# Patient Record
Sex: Male | Born: 1960 | Race: White | Hispanic: No | Marital: Married | Smoking: Never smoker
Health system: Southern US, Community
[De-identification: ages and names within clinical notes are randomized; demographics above are authoritative.]

## PROBLEM LIST (undated history)

## (undated) DIAGNOSIS — E785 Hyperlipidemia, unspecified: Secondary | ICD-10-CM

## (undated) HISTORY — PX: TONSILLECTOMY: SUR1361

---

## 2004-05-01 ENCOUNTER — Ambulatory Visit: Payer: Self-pay | Admitting: Otolaryngology

## 2005-08-20 ENCOUNTER — Emergency Department: Payer: Self-pay | Admitting: General Practice

## 2005-08-23 ENCOUNTER — Emergency Department: Payer: Self-pay | Admitting: Emergency Medicine

## 2005-08-27 ENCOUNTER — Emergency Department: Payer: Self-pay | Admitting: Unknown Physician Specialty

## 2005-09-03 ENCOUNTER — Emergency Department: Payer: Self-pay | Admitting: Emergency Medicine

## 2005-09-18 ENCOUNTER — Emergency Department: Payer: Self-pay | Admitting: Emergency Medicine

## 2007-02-02 ENCOUNTER — Ambulatory Visit: Payer: Self-pay | Admitting: Family Medicine

## 2007-02-03 ENCOUNTER — Ambulatory Visit: Payer: Self-pay | Admitting: Family Medicine

## 2007-04-21 ENCOUNTER — Ambulatory Visit: Payer: Self-pay | Admitting: Family Medicine

## 2007-05-10 ENCOUNTER — Ambulatory Visit: Payer: Self-pay | Admitting: Otolaryngology

## 2007-10-16 ENCOUNTER — Ambulatory Visit: Payer: Self-pay | Admitting: Internal Medicine

## 2009-02-06 IMAGING — CT CT MAXILLOFACIAL WITHOUT CONTRAST
1 series · 16 of 30 positions shown, 20 images · non-contrast
Comparison: none

REASON FOR EXAM: Nasal Obstruction Deviated Septum
COMMENTS:

[Series 3: (person_name) series (id) · axial · 0.33mm/px · z∈[-706,-508]mm · 16 of 214 slices shown, 20 images]
[im 8/214  brain]
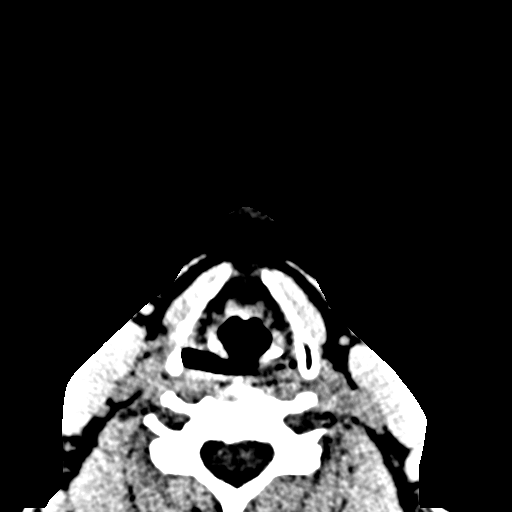
[im 8/214  bone]
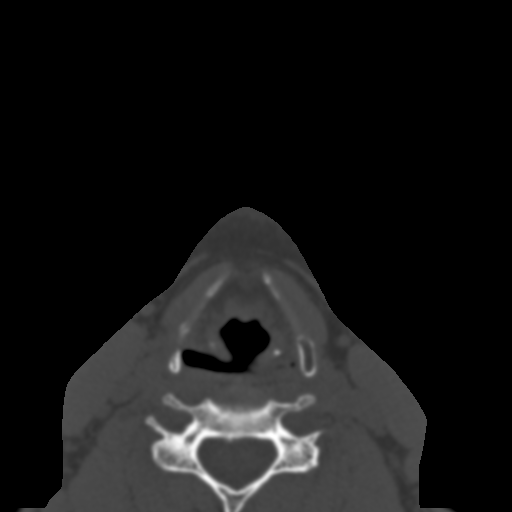
[im 23/214  bone]
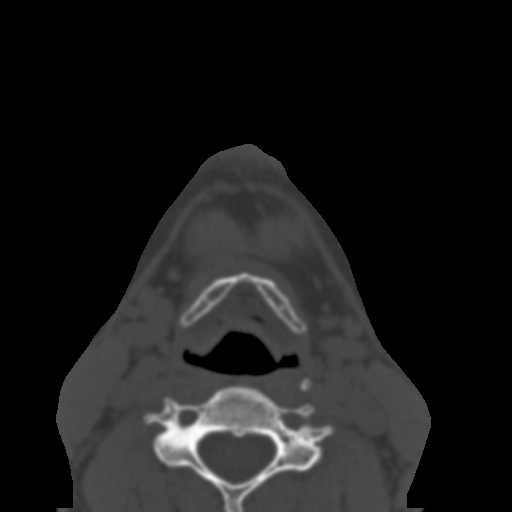
[im 37/214  bone]
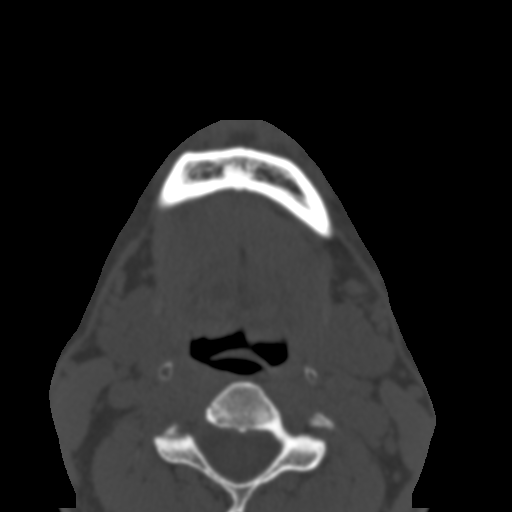
[im 52/214  bone]
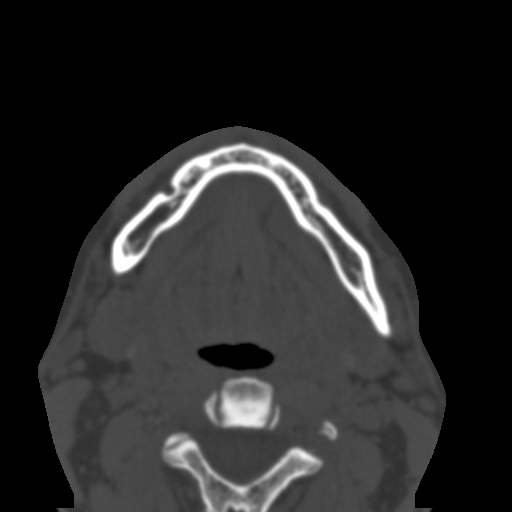
[im 59/214  brain]
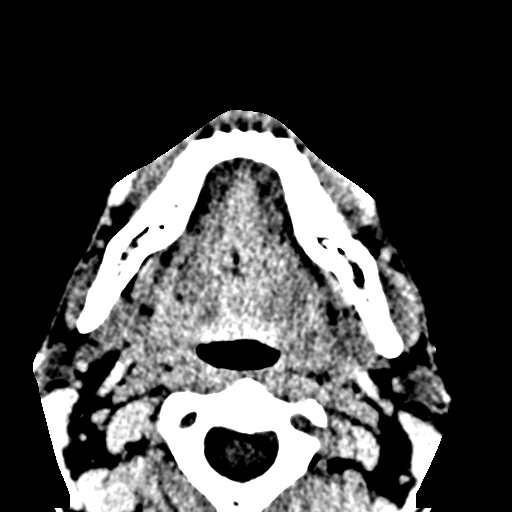
[im 59/214  bone]
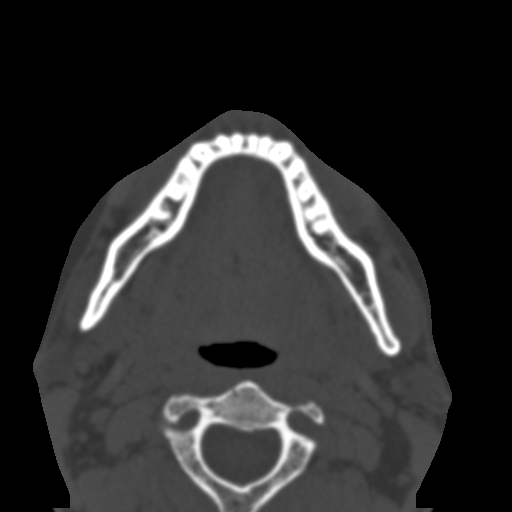
[im 74/214  bone]
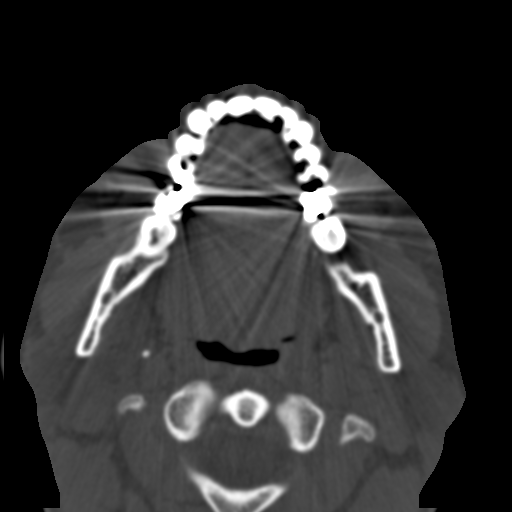
[im 89/214  bone]
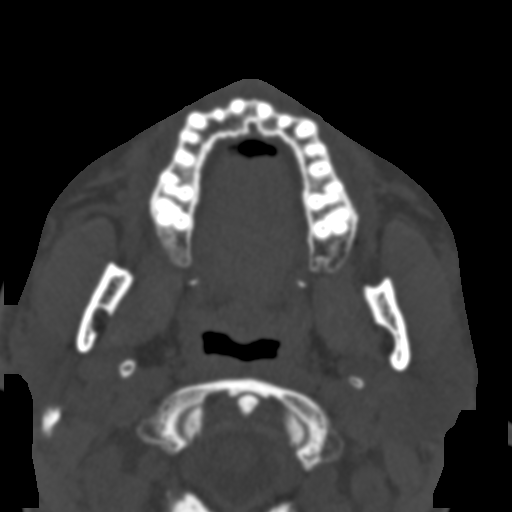
[im 103/214  bone]
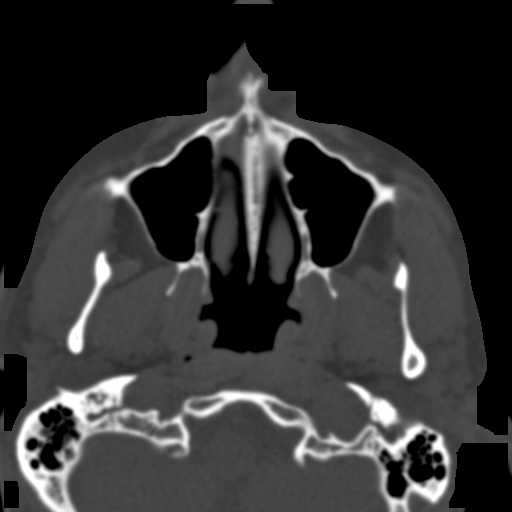
[im 111/214  brain]
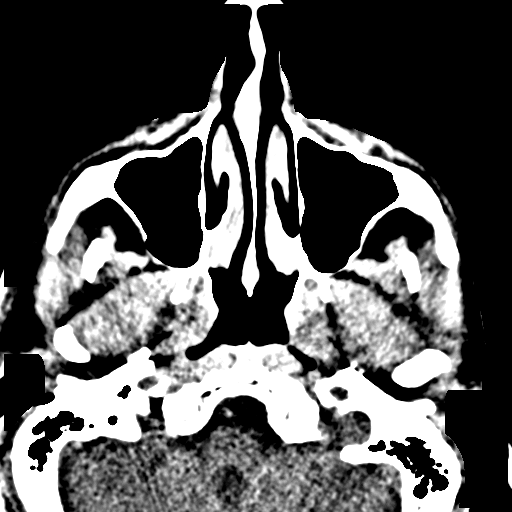
[im 111/214  bone]
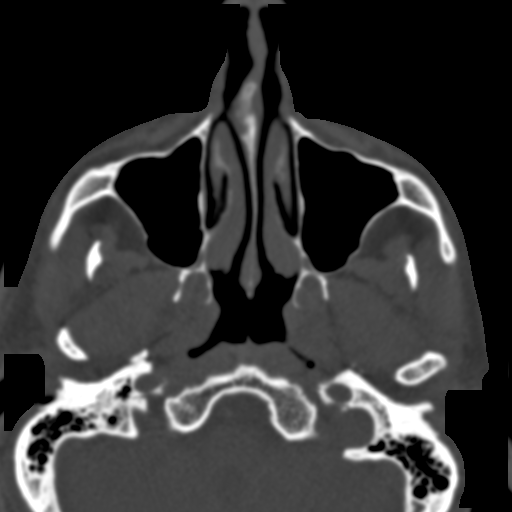
[im 125/214  bone]
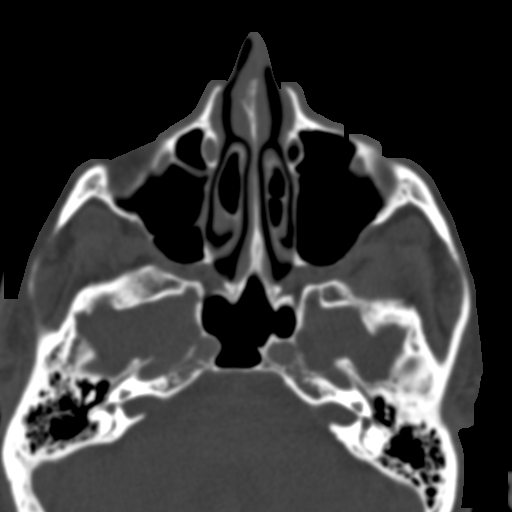
[im 140/214  bone]
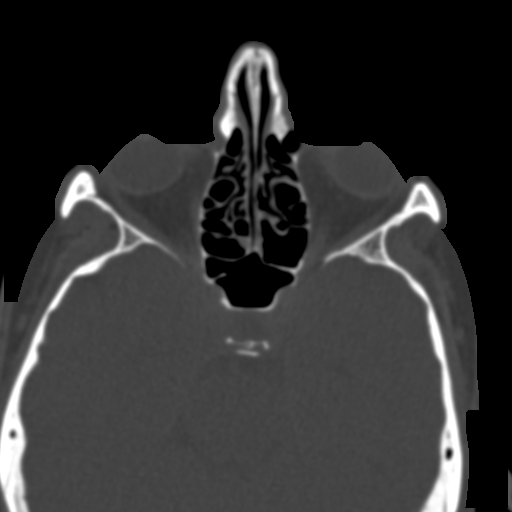
[im 155/214  bone]
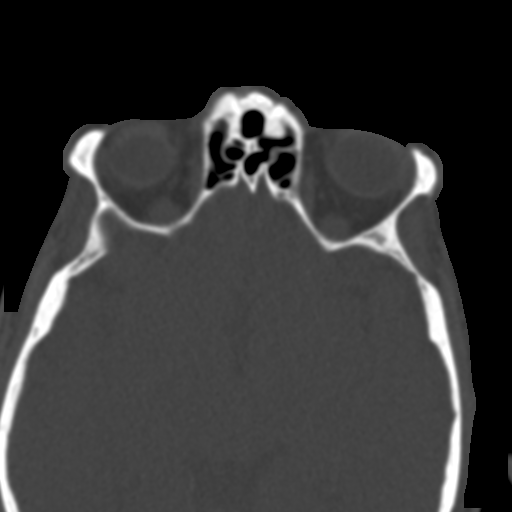
[im 162/214  brain]
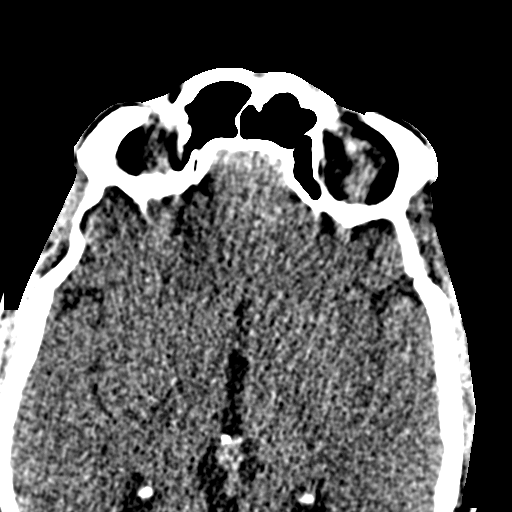
[im 162/214  bone]
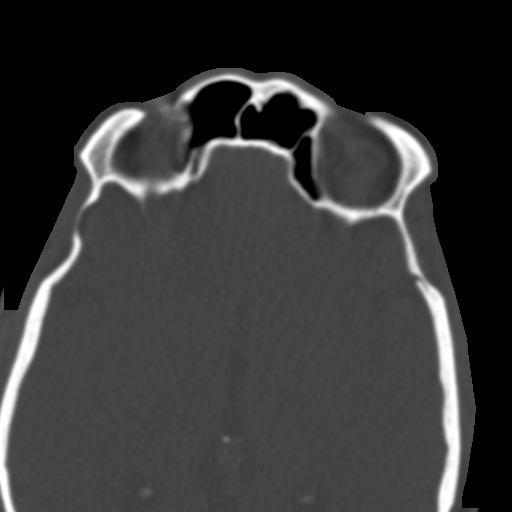
[im 177/214  bone]
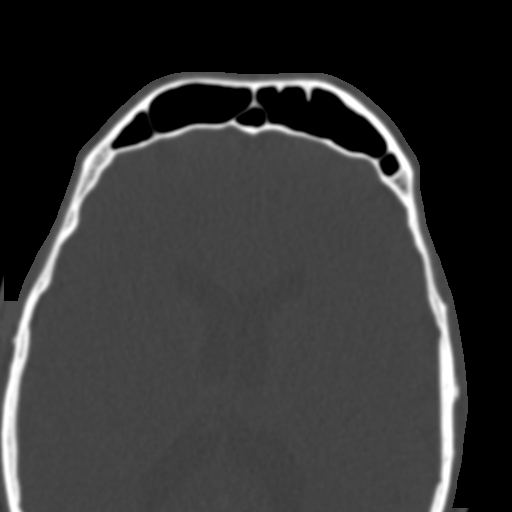
[im 191/214  bone]
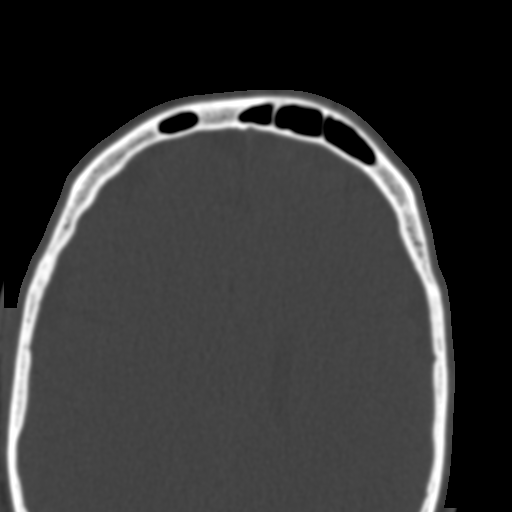
[im 206/214  bone]
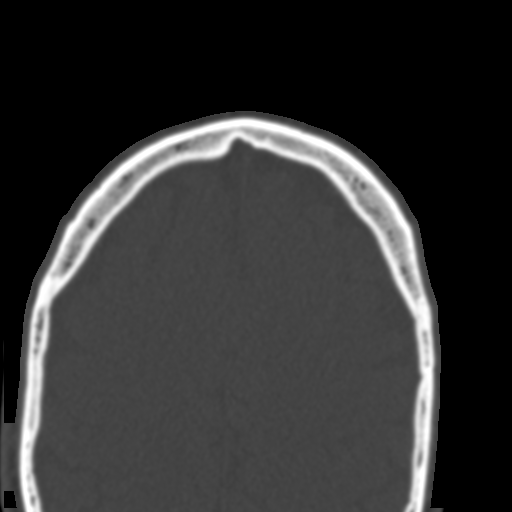

[16 of 30 positions shown; findings below may reference images not displayed]

PROCEDURE:     CT  - CT MAXILLOFACIAL ([facility])  - May 10, 2007 [DATE]

RESULT:     CT of the maxillofacial structures is reconstructed in the
axial, sagittal and coronal planes. The nasal septum shows minimal curvature
toward the LEFT. There is nasal mucosal thickening. The sinuses demonstrate
grossly normal aeration. The ostia are narrow but patent. There is no
fracture or osseous abnormality.
IMPRESSION: No evidence of sinusitis or fracture. The nasal septum
shows minimal curvature toward the LEFT which may not be clinically
significant. Clinical correlation is suggested.

## 2011-05-25 ENCOUNTER — Emergency Department: Payer: Self-pay | Admitting: Emergency Medicine

## 2011-05-25 LAB — CBC
HCT: 46.6 % (ref 40.0–52.0)
MCH: 31.3 pg (ref 26.0–34.0)
Platelet: 199 10*3/uL (ref 150–440)
RBC: 5.1 10*6/uL (ref 4.40–5.90)
WBC: 5.5 10*3/uL (ref 3.8–10.6)

## 2011-05-25 LAB — BASIC METABOLIC PANEL
Anion Gap: 5 — ABNORMAL LOW (ref 7–16)
Calcium, Total: 9.2 mg/dL (ref 8.5–10.1)
Chloride: 102 mmol/L (ref 98–107)
Co2: 31 mmol/L (ref 21–32)
EGFR (African American): >60
EGFR (Non-African Amer.): >60
Glucose: 81 mg/dL (ref 65–99)
Osmolality: 275 (ref 275–301)
Potassium: 4 mmol/L (ref 3.5–5.1)

## 2011-05-25 LAB — TROPONIN I: Troponin-I: <0.02 ng/mL

## 2011-05-25 LAB — CK TOTAL AND CKMB (NOT AT ARMC)
CK, Total: 131 U/L (ref 35–232)
CK-MB: 1.5 ng/mL (ref 0.5–3.6)

## 2012-07-08 LAB — HEPATIC FUNCTION PANEL
ALK PHOS: 85 U/L (ref 25–125)
ALT: 16 U/L (ref 10–40)
AST: 17 U/L (ref 14–40)
BILIRUBIN, TOTAL: 0.7 mg/dL

## 2012-07-08 LAB — BASIC METABOLIC PANEL
BUN: 11 mg/dL (ref 4–21)
CREATININE: 1.1 mg/dL (ref <=1.3)
Glucose: 93 mg/dL
POTASSIUM: 4.6 mmol/L (ref 3.4–5.3)
SODIUM: 141 mmol/L (ref 137–147)

## 2012-07-08 LAB — LIPID PANEL
Cholesterol: 213 mg/dL — AB (ref 0–200)
HDL: 51 mg/dL (ref 35–70)
LDL Cholesterol: 140 mg/dL
LDL/HDL RATIO: 2.7
Triglycerides: 109 mg/dL (ref 40–160)

## 2012-07-08 LAB — CBC AND DIFFERENTIAL
HCT: 45 % (ref 41–53)
HEMOGLOBIN: 15.7 g/dL (ref 13.5–17.5)
Platelets: 227 10*3/uL (ref 150–399)
WBC: 5.3 10^3/mL

## 2012-07-08 LAB — TSH: TSH: 0.76 u[IU]/mL (ref <=5.90)

## 2012-07-08 LAB — PSA: PSA: 0.5

## 2012-08-19 ENCOUNTER — Ambulatory Visit: Payer: Self-pay | Admitting: Gastroenterology

## 2012-08-20 LAB — PATHOLOGY REPORT

## 2012-08-26 LAB — HM COLONOSCOPY

## 2013-02-21 IMAGING — CT CT HEAD WITHOUT CONTRAST
2 series · 16 of 30 positions shown, 20 images · non-contrast
Comparison: none

REASON FOR EXAM: syncope
COMMENTS:

PROCEDURE:     CT  - CT HEAD WITHOUT CONTRAST  - May 25, 2011  [DATE]
RESULT:     Comparison:  None
TECHNIQUE: Multiple axial images from the foramen magnum to the vertex were
obtained without IV contrast.

[Series 2: without · axial · non-contrast · 0.44mm/px · z∈[+408,+542]mm · 13 of 33 slices shown, 17 images]
[im 3/33  brain]
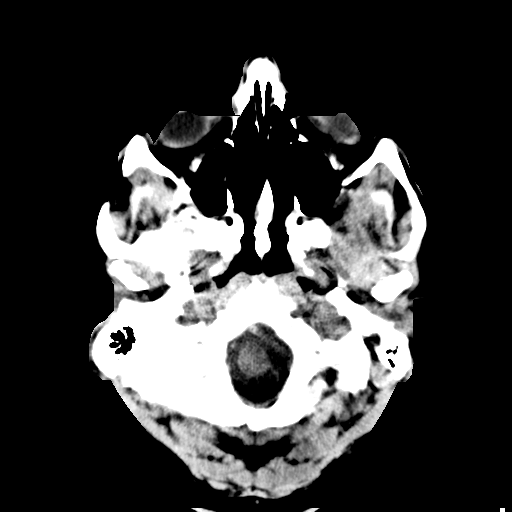
[im 3/33  bone]
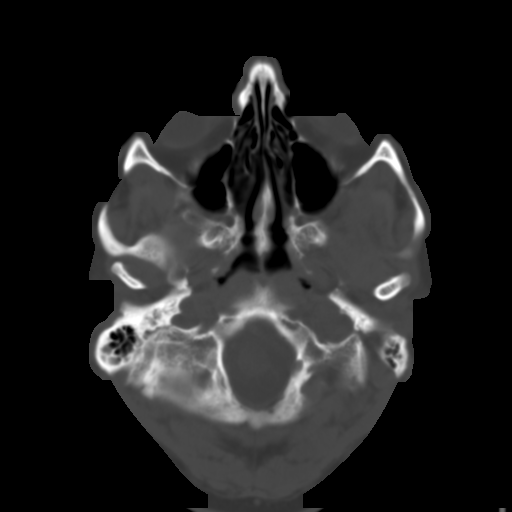
[im 5/33  brain]
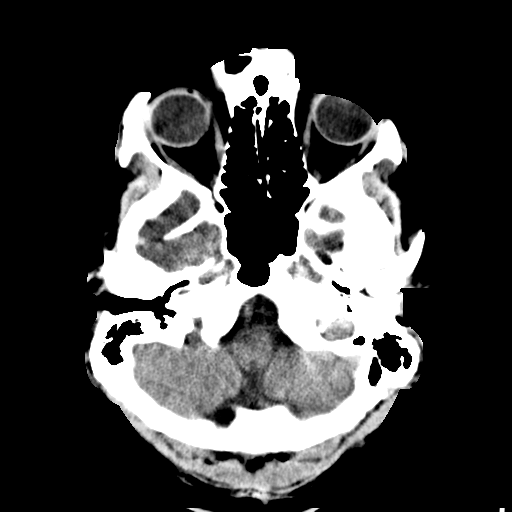
[im 7/33  brain]
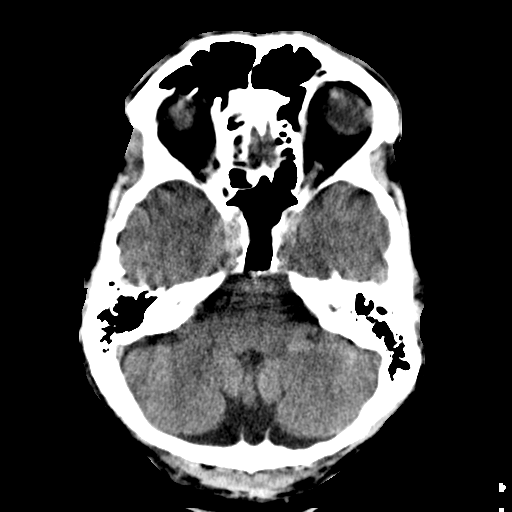
[im 10/33  brain]
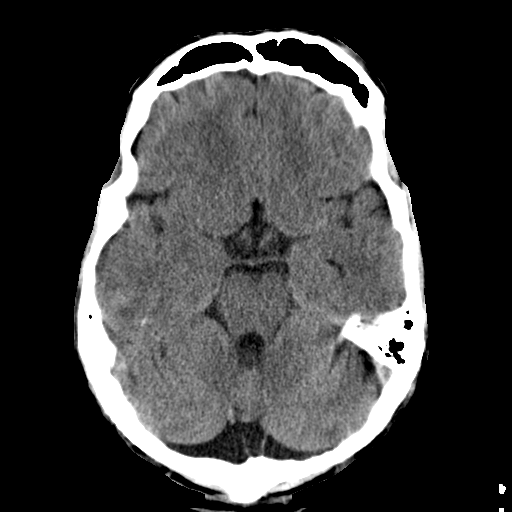
[im 12/33  brain]
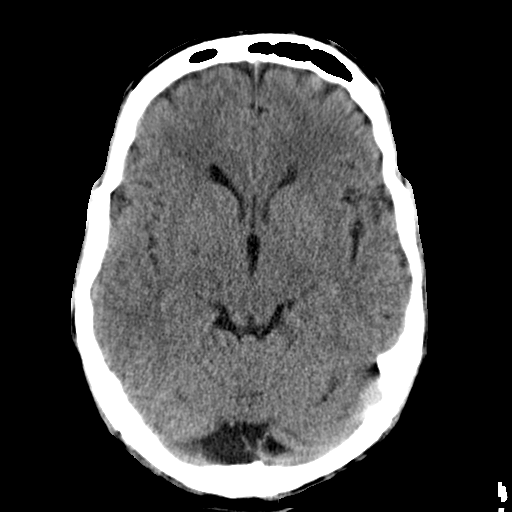
[im 12/33  bone]
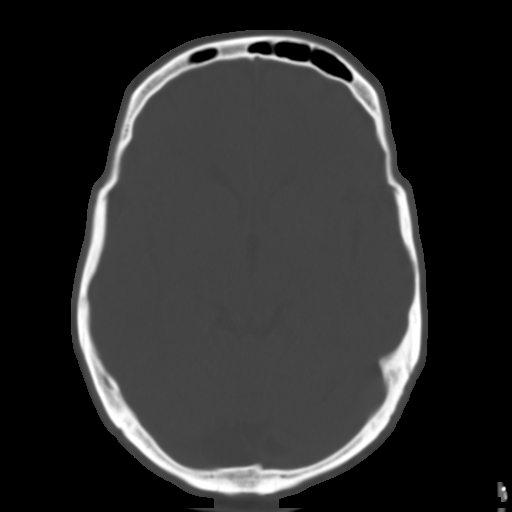
[im 14/33  brain]
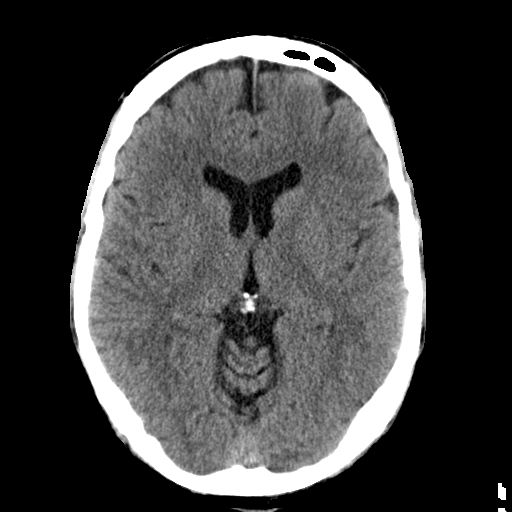
[im 17/33  brain]
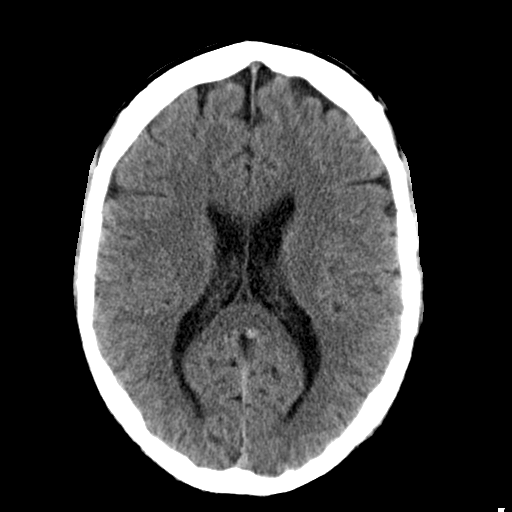
[im 19/33  brain]
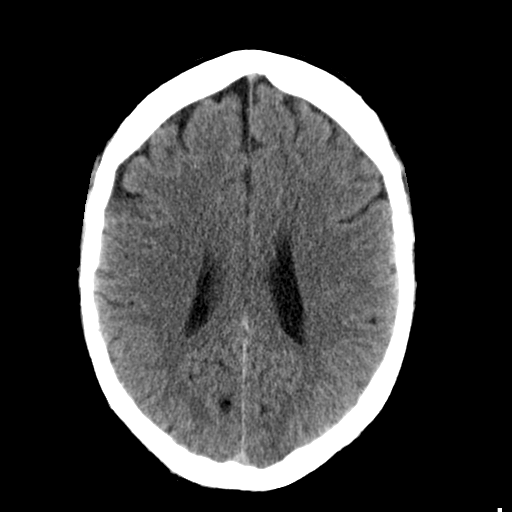
[im 21/33  brain]
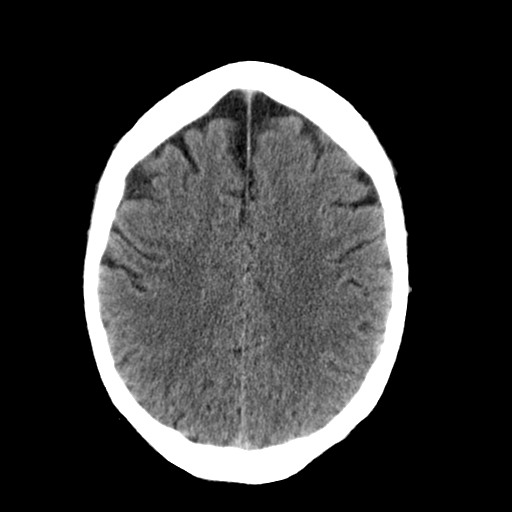
[im 21/33  bone]
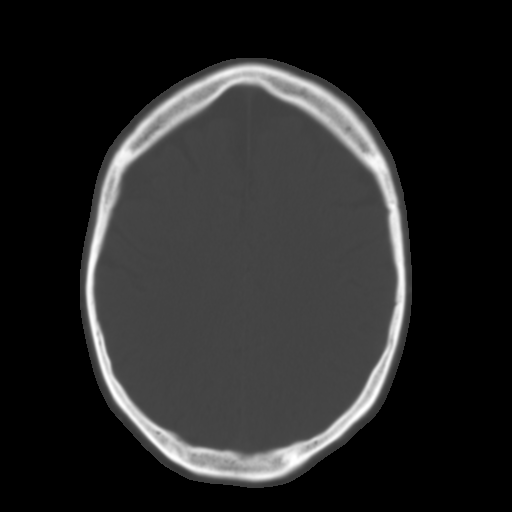
[im 23/33  brain]
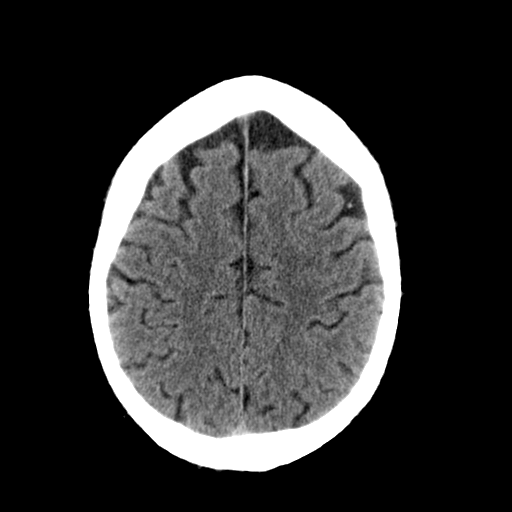
[im 26/33  brain]
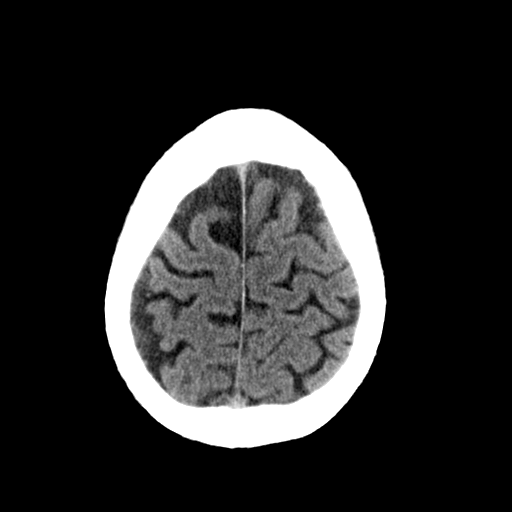
[im 28/33  brain]
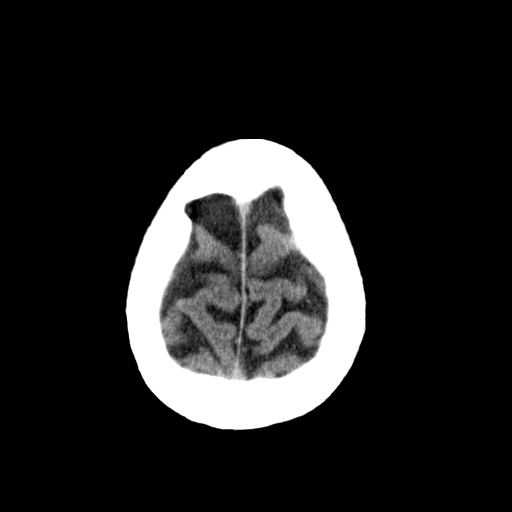
[im 30/33  brain]
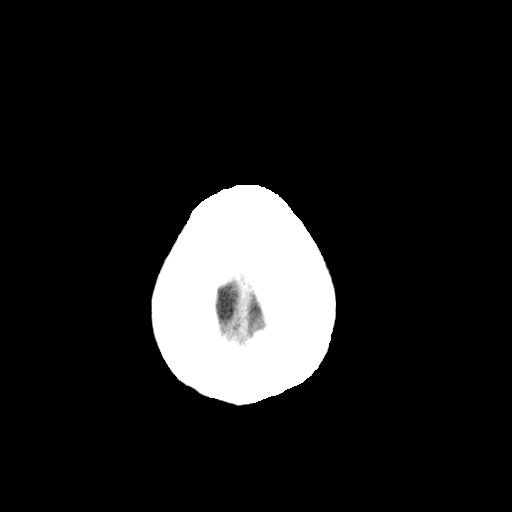
[im 30/33  bone]
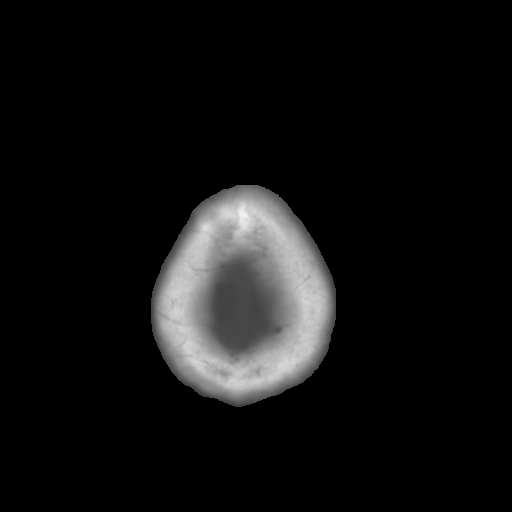

[Series 3: bone · axial · 0.44mm/px · z∈[+408,+452]mm · 3 of 33 slices shown]
[im 3/33  bone]
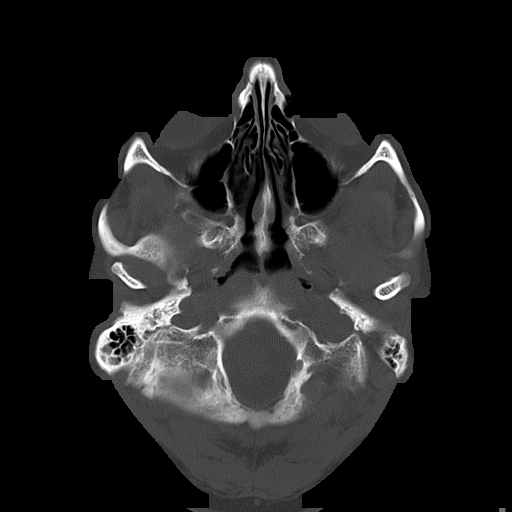
[im 7/33  bone]
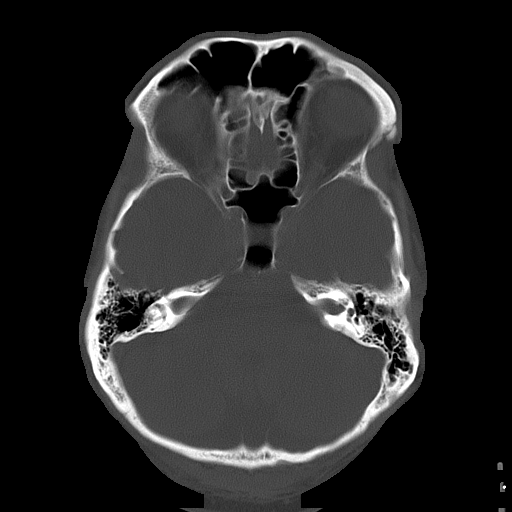
[im 12/33  bone]
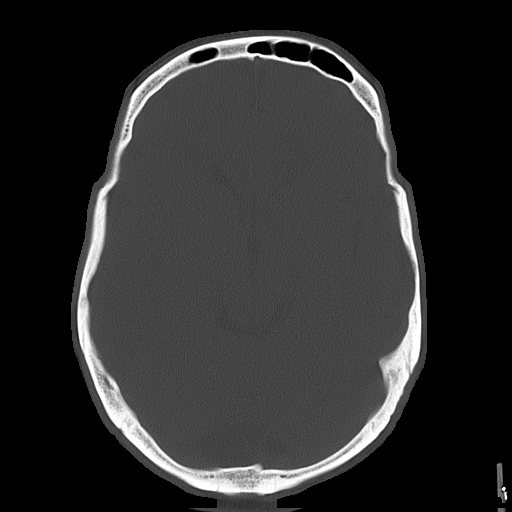

[16 of 30 positions shown; findings below may reference images not displayed]

FINDINGS: There is no evidence of mass effect, midline shift, or extra-axial fluid
collections.  There is no evidence of a space-occupying lesion or
intracranial hemorrhage. There is no evidence of a cortical-based area of
acute infarction.

The ventricles and sulci are appropriate for the patient's age. The basal
cisterns are patent.

Visualized portions of the orbits are unremarkable. The visualized portions
of the paranasal sinuses and mastoid air cells are unremarkable.

The osseous structures are unremarkable.
IMPRESSION: No acute intracranial process.

## 2014-08-02 DIAGNOSIS — E785 Hyperlipidemia, unspecified: Secondary | ICD-10-CM | POA: Insufficient documentation

## 2014-10-10 ENCOUNTER — Encounter: Payer: Self-pay | Admitting: Family Medicine

## 2014-11-15 ENCOUNTER — Ambulatory Visit (INDEPENDENT_AMBULATORY_CARE_PROVIDER_SITE_OTHER): Payer: 59 | Admitting: Family Medicine

## 2014-11-15 ENCOUNTER — Encounter: Payer: Self-pay | Admitting: Family Medicine

## 2014-11-15 VITALS — BP 110/72 | HR 72 | Temp 98.4°F | Resp 20 | Ht 77.0 in | Wt 239.0 lb

## 2014-11-15 DIAGNOSIS — Z0000 Encounter for general adult medical examination without abnormal findings: Secondary | ICD-10-CM | POA: Diagnosis not present

## 2014-11-15 LAB — POCT URINALYSIS DIPSTICK
Bilirubin, UA: NEGATIVE
Blood, UA: NEGATIVE
Glucose, UA: NEGATIVE
Ketones, UA: NEGATIVE
Leukocytes, UA: NEGATIVE
NITRITE UA: NEGATIVE
PROTEIN UA: NEGATIVE
SPEC GRAV UA: 1.015
UROBILINOGEN UA: 0.2
pH, UA: 6.5

## 2014-11-15 LAB — IFOBT (OCCULT BLOOD): IMMUNOLOGICAL FECAL OCCULT BLOOD TEST: NEGATIVE

## 2014-11-15 NOTE — Progress Notes (Signed)
Patient: John Lopez, Male    DOB: 1960-05-03, 54 y.o.   MRN: [id number] Visit Date: 11/15/2014  Today's Provider: Megan Mans, MD   Chief Complaint  Patient presents with  . Annual Exam   Subjective:    Annual physical exam John Lopez is a 54 y.o. male who presents today for health maintenance and complete physical. He feels well. He reports exercising twice weekly, walking/jogging. He reports he is sleeping well.  ----------------------------------------------------------------- Last CPE- 06/08/2012 Last Colonoscopy- 08/19/2012. Dr. Servando Snare. 2 polyps (adenoma). Repeat 3 years Last EKG- 06/10/2011 Last TDap- 06/23/2005 Last PSA- 07/08/2012- 0.5 *Will get flu immunization through hospital  Review of Systems  HENT: Positive for congestion.   Allergic/Immunologic: Positive for environmental allergies.  All other systems reviewed and are negative.   Social History He  reports that he has never smoked. He has never used smokeless tobacco. He reports that he drinks alcohol. He reports that he does not use illicit drugs. Social History   Social History  . Marital Status: Married    Spouse Name: Cipriano Mile  . Number of Children: 2  . Years of Education: 20   Occupational History  . Director of Facilities [employer]   Social History Main Topics  . Smoking status: Never Smoker   . Smokeless tobacco: Never Used  . Alcohol Use: Yes  . Drug Use: No  . Sexual Activity: Not Asked   Other Topics Concern  . None   Social History Narrative    Patient Active Problem List   Diagnosis Date Noted  . HLD (hyperlipidemia) 08/02/2014  . Allergic rhinitis 03/31/2007    Past Surgical History  Procedure Laterality Date  . Tonsillectomy      Family History  Family Status  Relation Status Death Age  . Mother Alive   . Father Alive   . Sister Alive   . Daughter Alive   . Sister Alive    His family history includes Alzheimer's disease in his mother;  Healthy in his daughter, sister, and sister; Heart attack (age of onset: 43) in his father; Heart disease in his father; Hyperlipidemia in his mother; Macular degeneration in his mother; Prostate cancer (age of onset: 87) in his father.    No Known Allergies  Previous Medications   FLUTICASONE (FLONASE) 50 MCG/ACT NASAL SPRAY    Place into the nose.    Patient Care Team: Maple Hudson., MD as PCP - General (Family Medicine)     Objective:   Vitals: BP 110/72 mmHg  Pulse 72  Temp(Src) 98.4 F (36.9 C) (Oral)  Resp 20  Ht 6\' 5"  (1.956 m)  Wt 239 lb (108.41 kg)  BMI 28.34 kg/m2   Physical Exam  Constitutional: He is oriented to person, place, and time. He appears well-developed and well-nourished.  HENT:  Head: Normocephalic and atraumatic.  Right Ear: External ear normal.  Left Ear: External ear normal.  Nose: Nose normal.  Mouth/Throat: Oropharynx is clear and moist.  Eyes: Conjunctivae and EOM are normal. Pupils are equal, round, and reactive to light.  Neck: Neck supple.  Cardiovascular: Normal rate, regular rhythm, normal heart sounds and intact distal pulses.   Pulmonary/Chest: Effort normal and breath sounds normal.  Abdominal: Soft. Bowel sounds are normal.  Genitourinary: Rectum normal, prostate normal and penis normal.  Musculoskeletal: Normal range of motion.  Neurological: He is alert and oriented to person, place, and time.  Skin: Skin is warm and dry.  Psychiatric: He has a normal mood and affect. His behavior is normal. Judgment and thought content normal.     Depression Screen No flowsheet data found.    Assessment & Plan:     Routine Health Maintenance and Physical Exam  Exercise Activities and Dietary recommendations Goals    None      Immunization History  Administered Date(s) Administered  . Hepatitis A 09/08/2006, 03/29/2007  . Tdap 06/23/2005  . Typhoid Inactivated 09/10/2006    Health Maintenance  Topic Date Due  . HIV  Screening  08/27/1975  . TETANUS/TDAP  08/27/1979  . INFLUENZA VACCINE  10/09/2014  . COLONOSCOPY  08/27/2022  . Hepatitis C Screening  Completed      Discussed health benefits of physical activity, and encouraged him to engage in regular exercise appropriate for his age and condition.   stress level is high but patient is excited as he has had accepted a new job with [employer]. Some stress of this as he has been with [employer] since October 1994. He is excited about the challenge. --------------------------------------------------------------------

## 2015-11-20 ENCOUNTER — Encounter: Payer: 59 | Admitting: Family Medicine

## 2017-08-10 ENCOUNTER — Ambulatory Visit: Payer: PRIVATE HEALTH INSURANCE | Admitting: Family Medicine

## 2017-08-10 ENCOUNTER — Encounter: Payer: Self-pay | Admitting: Family Medicine

## 2017-08-10 VITALS — BP 108/78 | HR 110 | Temp 98.0°F | Resp 16 | Ht 77.0 in | Wt 243.0 lb

## 2017-08-10 DIAGNOSIS — M25561 Pain in right knee: Secondary | ICD-10-CM

## 2017-08-10 DIAGNOSIS — G8929 Other chronic pain: Secondary | ICD-10-CM | POA: Diagnosis not present

## 2017-08-10 DIAGNOSIS — Z1211 Encounter for screening for malignant neoplasm of colon: Secondary | ICD-10-CM | POA: Diagnosis not present

## 2017-08-10 DIAGNOSIS — Z125 Encounter for screening for malignant neoplasm of prostate: Secondary | ICD-10-CM

## 2017-08-10 DIAGNOSIS — Z0000 Encounter for general adult medical examination without abnormal findings: Secondary | ICD-10-CM

## 2017-08-10 LAB — POCT URINALYSIS DIPSTICK
Bilirubin, UA: NEGATIVE
Glucose, UA: NEGATIVE
Ketones, UA: NEGATIVE
LEUKOCYTES UA: NEGATIVE
NITRITE UA: NEGATIVE
PH UA: 5 (ref 5.0–8.0)
PROTEIN UA: NEGATIVE
RBC UA: NEGATIVE
SPEC GRAV UA: 1.025 (ref 1.010–1.025)
Urobilinogen, UA: 0.2 E.U./dL

## 2017-08-10 MED ORDER — PSYLLIUM 28 % PO PACK: 1.0000 | PACK | Freq: Two times a day (BID) | ORAL | 12 refills | Status: AC

## 2017-08-10 MED ORDER — NAPROXEN 500 MG PO TABS: 500.0000 mg | ORAL_TABLET | Freq: Two times a day (BID) | ORAL | 0 refills | Status: AC

## 2017-08-10 NOTE — Progress Notes (Signed)
Patient: [name], Male    DOB: [birth year], 57 y.o.   MRN: [id number] Visit Date: 08/10/2017  Today's Provider: Megan Mans, MD   Chief Complaint  Patient presents with  . Annual Exam   Subjective:  John Lopez is a 57 y.o. male who presents today for health maintenance and complete physical. He feels well. He reports exercising twice weekly.He reports he is sleeping well.  08/26/12 Colonoscopy, Wohl-tubular adenoma, repeat 5 years.  Review of Systems  Constitutional: Negative.   HENT: Negative.   Eyes: Negative.   Respiratory: Negative.   Cardiovascular: Negative.   Gastrointestinal: Positive for constipation.  Endocrine: Negative.   Genitourinary: Negative.   Musculoskeletal: Positive for arthralgias (right knee).  Skin: Negative.   Allergic/Immunologic: Negative.   Neurological: Negative.   Hematological: Negative.   Psychiatric/Behavioral: Negative.     Social History   Socioeconomic History  . Marital status: Married    Spouse name: Cipriano Mile  . Number of children: 2  . Years of education: 20  . Highest education level: Not on file  Occupational History  . Occupation: Financial planner: [employer]  Social Needs  . Financial resource strain: Not on file  . Food insecurity:    Worry: Not on file    Inability: Not on file  . Transportation needs:    Medical: Not on file    Non-medical: Not on file  Tobacco Use  . Smoking status: Never Smoker  . Smokeless tobacco: Never Used  Substance and Sexual Activity  . Alcohol use: Yes  . Drug use: No  . Sexual activity: Not on file  Lifestyle  . Physical activity:    Days per week: Not on file    Minutes per session: Not on file  . Stress: Not on file  Relationships  . Social connections:    Talks on phone: Not on file    Gets together: Not on file    Attends religious service: Not on file    Active member of club or organization: Not on file    Attends meetings of clubs or  organizations: Not on file    Relationship status: Not on file  . Intimate partner violence:    Fear of current or ex partner: Not on file    Emotionally abused: Not on file    Physically abused: Not on file    Forced sexual activity: Not on file  Other Topics Concern  . Not on file  Social History Narrative  . Not on file    Patient Active Problem List   Diagnosis Date Noted  . HLD (hyperlipidemia) 08/02/2014  . Allergic rhinitis 03/31/2007    Past Surgical History:  Procedure Laterality Date  . TONSILLECTOMY      His family history includes Alzheimer's disease in his mother; Healthy in his daughter, sister, and sister; Heart attack (age of onset: 68) in his father; Heart disease in his father; Hyperlipidemia in his mother; Macular degeneration in his mother; Prostate cancer (age of onset: 40) in his father.     Outpatient Encounter Medications as of 08/10/2017  Medication Sig Note  . fluticasone (FLONASE) 50 MCG/ACT nasal spray Place into the nose. 08/02/2014: Received from: [facility]   No facility-administered encounter medications on file as of 08/10/2017.     Patient Care Team: Maple Hudson., MD as PCP - General (Family Medicine)      Objective:   Vitals:  Vitals:  08/10/17 1521  BP: 108/78  Pulse: (!) 110  Resp: 16  Temp: 98 F (36.7 C)  TempSrc: Oral  Weight: 243 lb (110.2 kg)  Height: 6\' 5"  (1.956 m)    Physical Exam  Constitutional: He is oriented to person, place, and time. He appears well-developed and well-nourished.  HENT:  Head: Normocephalic and atraumatic.  Right Ear: External ear normal.  Left Ear: External ear normal.  Nose: Nose normal.  Mouth/Throat: Oropharynx is clear and moist.  Eyes: Pupils are equal, round, and reactive to light. Conjunctivae and EOM are normal.  Neck: Normal range of motion. Neck supple.  Cardiovascular: Normal rate, regular rhythm, normal heart sounds and intact distal pulses.   Pulmonary/Chest: Effort normal and breath sounds normal.  Abdominal: Soft. Bowel sounds are normal.  Genitourinary: Rectum normal, prostate normal and penis normal.  Musculoskeletal: Normal range of motion.  Neurological: He is alert and oriented to person, place, and time.  Skin: Skin is warm and dry.  Psychiatric: He has a normal mood and affect. His behavior is normal. Judgment and thought content normal.     Depression Screen PHQ 2/9 Scores 08/10/2017  PHQ - 2 Score 0      Assessment & Plan:     Routine Health Maintenance and Physical Exam  Exercise Activities and Dietary recommendations Goals    None      Immunization History  Administered Date(s) Administered  . Hepatitis A 09/08/2006, 03/29/2007  . Tdap 06/23/2005  . Typhoid Inactivated 09/10/2006    Health Maintenance  Topic Date Due  . HIV Screening  08/27/1975  . TETANUS/TDAP  06/24/2015  . INFLUENZA VACCINE  10/08/2017  . COLONOSCOPY  08/27/2022  . Hepatitis C Screening  Completed    Time for colonoscopy. Discussed health benefits of physical activity, and encouraged him to engage in regular exercise appropriate for his age and condition.  Constipation Add Psyllium. Right knee pain/possible Right IT Band Try Naproxen prn. AKs Refer to Dermatology.   I have done the exam and reviewed the chart and it is accurate to the best of my knowledge. [employer]  technology has been used and  any errors in dictation or transcription are unintentional. Julieanne Mansonichard Calil Amor M.D. [facility]

## 2018-03-04 ENCOUNTER — Encounter: Payer: Self-pay | Admitting: Family Medicine

## 2018-03-15 ENCOUNTER — Other Ambulatory Visit: Payer: Self-pay

## 2018-03-15 DIAGNOSIS — E785 Hyperlipidemia, unspecified: Secondary | ICD-10-CM

## 2018-03-15 MED ORDER — ROSUVASTATIN CALCIUM 10 MG PO TABS: 10.0000 mg | ORAL_TABLET | Freq: Every day | ORAL | 11 refills | Status: DC

## 2018-03-15 NOTE — Progress Notes (Signed)
Ok per Dr. Gilbert.  

## 2018-03-21 ENCOUNTER — Encounter: Payer: Self-pay | Admitting: Family Medicine

## 2018-03-24 ENCOUNTER — Other Ambulatory Visit: Payer: Self-pay

## 2018-03-24 DIAGNOSIS — E785 Hyperlipidemia, unspecified: Secondary | ICD-10-CM

## 2018-03-24 MED ORDER — FLUTICASONE PROPIONATE 50 MCG/ACT NA SUSP: 2.0000 | Freq: Every day | NASAL | 12 refills | Status: DC

## 2018-03-24 MED ORDER — ROSUVASTATIN CALCIUM 10 MG PO TABS: 10.0000 mg | ORAL_TABLET | Freq: Every day | ORAL | 11 refills | Status: DC

## 2018-03-25 ENCOUNTER — Other Ambulatory Visit: Payer: Self-pay

## 2018-03-25 DIAGNOSIS — E785 Hyperlipidemia, unspecified: Secondary | ICD-10-CM

## 2018-03-25 MED ORDER — FLUTICASONE PROPIONATE 50 MCG/ACT NA SUSP: 2.0000 | Freq: Every day | NASAL | 12 refills | Status: AC

## 2018-03-25 MED ORDER — ROSUVASTATIN CALCIUM 10 MG PO TABS: 10.0000 mg | ORAL_TABLET | Freq: Every day | ORAL | 11 refills | Status: AC

## 2018-04-10 ENCOUNTER — Encounter: Payer: Self-pay | Admitting: Family Medicine

## 2018-04-12 ENCOUNTER — Other Ambulatory Visit: Payer: Self-pay

## 2018-04-12 MED ORDER — OSELTAMIVIR PHOSPHATE 75 MG PO CAPS: 75.0000 mg | ORAL_CAPSULE | Freq: Every day | ORAL | 0 refills | Status: AC

## 2018-05-10 ENCOUNTER — Encounter: Payer: Self-pay | Admitting: Family Medicine

## 2018-11-24 ENCOUNTER — Other Ambulatory Visit: Payer: Self-pay

## 2018-11-24 DIAGNOSIS — Z20822 Contact with and (suspected) exposure to covid-19: Secondary | ICD-10-CM

## 2018-11-25 LAB — NOVEL CORONAVIRUS, NAA: SARS-CoV-2, NAA: NOT DETECTED

## 2019-01-31 ENCOUNTER — Other Ambulatory Visit: Payer: Self-pay

## 2019-01-31 DIAGNOSIS — Z20822 Contact with and (suspected) exposure to covid-19: Secondary | ICD-10-CM

## 2019-02-01 LAB — NOVEL CORONAVIRUS, NAA: SARS-CoV-2, NAA: NOT DETECTED

## 2020-03-11 ENCOUNTER — Emergency Department
Admission: EM | Admit: 2020-03-11 | Discharge: 2020-03-11 | Disposition: A | Discharge status: Home / Self Care | Payer: PRIVATE HEALTH INSURANCE | Attending: Emergency Medicine | Admitting: Emergency Medicine

## 2020-03-11 ENCOUNTER — Encounter: Payer: Self-pay | Admitting: Intensive Care

## 2020-03-11 ENCOUNTER — Emergency Department: Payer: PRIVATE HEALTH INSURANCE

## 2020-03-11 ENCOUNTER — Other Ambulatory Visit: Payer: Self-pay

## 2020-03-11 DIAGNOSIS — W268XXA Contact with other sharp object(s), not elsewhere classified, initial encounter: Secondary | ICD-10-CM | POA: Diagnosis not present

## 2020-03-11 DIAGNOSIS — S91112A Laceration without foreign body of left great toe without damage to nail, initial encounter: Secondary | ICD-10-CM

## 2020-03-11 DIAGNOSIS — Y9301 Activity, walking, marching and hiking: Secondary | ICD-10-CM | POA: Insufficient documentation

## 2020-03-11 HISTORY — DX: Hyperlipidemia, unspecified: E78.5

## 2020-03-11 MED ORDER — HYDROCODONE-ACETAMINOPHEN 5-325 MG PO TABS
1.0000 | ORAL_TABLET | Freq: Four times a day (QID) | ORAL | 0 refills | Status: AC | PRN
Start: 2020-03-11 — End: 2020-03-14

## 2020-03-11 MED ORDER — CEPHALEXIN 500 MG PO CAPS: 1000.0000 mg | ORAL_CAPSULE | Freq: Two times a day (BID) | ORAL | 0 refills | Status: AC

## 2020-03-11 MED ORDER — LIDOCAINE HCL (PF) 1 % IJ SOLN
10.0000 mL | Freq: Once | INTRAMUSCULAR | Status: AC
  Administered 2020-03-11: 10 mL via INTRADERMAL
  Filled 2020-03-11: qty 10

## 2020-03-11 MED ORDER — SULFAMETHOXAZOLE-TRIMETHOPRIM 800-160 MG PO TABS
1.0000 | ORAL_TABLET | Freq: Once | ORAL | Status: AC
  Administered 2020-03-11: 1 via ORAL
  Filled 2020-03-11: qty 1

## 2020-03-11 MED ORDER — CEPHALEXIN 500 MG PO CAPS
1000.0000 mg | ORAL_CAPSULE | Freq: Once | ORAL | Status: AC
  Administered 2020-03-11: 1000 mg via ORAL
  Filled 2020-03-11: qty 2

## 2020-03-11 MED ORDER — SULFAMETHOXAZOLE-TRIMETHOPRIM 800-160 MG PO TABS: 1.0000 | ORAL_TABLET | Freq: Two times a day (BID) | ORAL | 0 refills | Status: AC

## 2020-03-11 NOTE — ED Triage Notes (Signed)
Patient presents with left great toe laceration. Was walking barefoot outside and cut toe on brick

## 2020-03-11 NOTE — ED Provider Notes (Signed)
[facility] Emergency Department Provider Note   ____________________________________________   Event Date/Time   First MD Initiated Contact with Patient 03/11/20 1926     (approximate)  I have reviewed the triage vital signs and the nursing notes.   HISTORY  Chief Complaint Extremity Laceration  HPI John Lopez is a 60 y.o. male who presents to the emergency department for evaluation of laceration to the left great toe.  He states that he was walking barefoot trying to take some trash out and upon his return, slipped on some brick stairs, cutting the undersurface of the left great toe.  He states that the area of the laceration is a bit numb but otherwise does not have any other complaints.  He states that Tdap is up-to-date, approximately 2 years ago.         Past Medical History:  Diagnosis Date  . Hyperlipidemia     Patient Active Problem List   Diagnosis Date Noted  . HLD (hyperlipidemia) 08/02/2014  . Allergic rhinitis 03/31/2007    Past Surgical History:  Procedure Laterality Date  . TONSILLECTOMY      Prior to Admission medications   Medication Sig Start Date End Date Taking? Authorizing Provider  cephALEXin (KEFLEX) 500 MG capsule Take 2 capsules (1,000 mg total) by mouth 2 (two) times daily for 10 days. 03/11/20 03/21/20 Yes Brigitte Soderberg, Farrel Gordon, PA  HYDROcodone-acetaminophen (NORCO) 5-325 MG tablet Take 1 tablet by mouth every 6 (six) hours as needed for up to 3 days for moderate pain. 03/11/20 03/14/20 Yes Nasean Zapf, Farrel Gordon, PA  sulfamethoxazole-trimethoprim (BACTRIM DS) 800-160 MG tablet Take 1 tablet by mouth 2 (two) times daily for 10 days. 03/11/20 03/21/20 Yes Karlyn Glasco, Farrel Gordon, PA  fluticasone (FLONASE) 50 MCG/ACT nasal spray Place 2 sprays into both nostrils daily. 03/25/18   Jerrol Banana., MD  naproxen (NAPROSYN) 500 MG tablet Take 1 tablet (500 mg total) by mouth 2 (two) times daily with a meal. 08/10/17   Jerrol Banana., MD  oseltamivir (TAMIFLU) 75 MG capsule Take 1 capsule (75 mg total) by mouth daily. 04/12/18   Jerrol Banana., MD  psyllium (METAMUCIL SMOOTH TEXTURE) 28 % packet Take 1 packet by mouth 2 (two) times daily. 08/10/17   Jerrol Banana., MD  rosuvastatin (CRESTOR) 10 MG tablet Take 1 tablet (10 mg total) by mouth daily. 03/25/18   Jerrol Banana., MD    Allergies Patient has no known allergies.  Family History  Problem Relation Age of Onset  . Hyperlipidemia Mother   . Alzheimer's disease Mother   . Macular degeneration Mother   . Heart disease Father   . Heart attack Father 18  . Prostate cancer Father 60  . Healthy Sister   . Healthy Daughter   . Healthy Sister     Social History Social History   Tobacco Use  . Smoking status: Never Smoker  . Smokeless tobacco: Never Used  Substance Use Topics  . Alcohol use: Yes    Alcohol/week: 8.0 standard drinks    Types: 4 Glasses of wine, 4 Cans of beer per week  . Drug use: No    Review of Systems Constitutional: No fever/chills Eyes: No visual changes. ENT: No sore throat. Cardiovascular: Denies chest pain. Respiratory: Denies shortness of breath. Gastrointestinal: No abdominal pain.  No nausea, no vomiting.  No diarrhea.  No constipation. Genitourinary: Negative for dysuria. Musculoskeletal: + Left great toe pain, negative for  back pain. Skin: + Left great toe laceration, negative for rash. Neurological: Negative for headaches, focal weakness or numbness.   ____________________________________________   PHYSICAL EXAM:  VITAL SIGNS: ED Triage Vitals  Enc Vitals Group     BP 03/11/20 1749 (!) 144/85     Pulse Rate 03/11/20 1749 85     Resp 03/11/20 1749 16     Temp 03/11/20 1749 98.4 F (36.9 C)     Temp Source 03/11/20 1749 Oral     SpO2 03/11/20 1749 98 %     Weight 03/11/20 1750 248 lb (112.5 kg)     Height 03/11/20 1750 6\' 5"  (1.956 m)     Head Circumference --      Peak Flow --       Pain Score 03/11/20 1750 1     Pain Loc --      Pain Edu? --      Excl. in GC? --    Constitutional: Alert and oriented. Well appearing and in no acute distress. Eyes: Conjunctivae are normal. PERRL. EOMI. Head: Atraumatic. Nose: No congestion/rhinnorhea. Mouth/Throat: Mucous membranes are moist.  Oropharynx non-erythematous. Neck: No stridor.   Cardiovascular: Normal rate, regular rhythm. Grossly normal heart sounds.  Good peripheral circulation. Respiratory: Normal respiratory effort.  No retractions. Lungs CTAB. Gastrointestinal: Soft and nontender. No distention. No abdominal bruits. No CVA tenderness. Musculoskeletal: Patient is able to actively flex and extend the left great toe Neurologic:  Normal speech and language. No gross focal neurologic deficits are appreciated. No gait instability. Skin: There is a 2 inch laceration of the plantar surface of the left great toe, and a slightly curved shape.  The edges well approximate, however there is still active bleeding.   Psychiatric: Mood and affect are normal. Speech and behavior are normal.   ____________________________________________  RADIOLOGY I, 05/09/20, personally viewed and evaluated these images (plain radiographs) as part of my medical decision making, as well as reviewing the written report by the radiologist.  ED provider interpretation: No identified foreign body  Official radiology report(s): DG Toe Great Left  Result Date: 03/11/2020 CLINICAL DATA:  Left great toe laceration after a fall. EXAM: LEFT GREAT TOE COMPARISON:  None. FINDINGS: Degenerative changes in the first metatarsal phalangeal and interphalangeal joints. No evidence of acute fracture or dislocation. Circumscribed lucent lesion in the first metatarsal head may represent degenerative cysts or possibly enchondroma. Soft tissues are unremarkable. No radiopaque soft tissue foreign bodies. IMPRESSION: Degenerative changes in the first  metatarsophalangeal and interphalangeal joints. No acute bony abnormalities. No radiopaque soft tissue foreign bodies. Electronically Signed   By: 05/09/2020 M.D.   On: 03/11/2020 20:00    ____________________________________________   PROCEDURES  Procedure(s) performed (including Critical Care):  03/02/2022Marland KitchenLaceration Repair  Date/Time: 03/11/2020 11:37 PM Performed by: 05/09/2020, PA Authorized by: Lucy Chris, PA   Consent:    Consent obtained:  Verbal   Consent given by:  Patient   Risks, benefits, and alternatives were discussed: yes     Risks discussed:  Infection, pain, retained foreign body and need for additional repair   Alternatives discussed:  No treatment and observation Universal protocol:    Procedure explained and questions answered to patient or proxy's satisfaction: yes     Imaging studies available: yes     Patient identity confirmed:  Verbally with patient Anesthesia:    Anesthesia method:  Nerve block   Block anesthetic:  Lidocaine 1% w/o epi   Block injection  procedure:  Anatomic landmarks identified, anatomic landmarks palpated, incremental injection and negative aspiration for blood   Block outcome:  Anesthesia achieved Laceration details:    Location:  Toe   Toe location:  L big toe   Length (cm):  4   Depth (mm):  7 Pre-procedure details:    Preparation:  Patient was prepped and draped in usual sterile fashion Exploration:    Hemostasis achieved with:  Direct pressure and tied off vessels   Imaging obtained: x-ray     Imaging outcome: foreign body not noted     Wound exploration: wound explored through full range of motion and entire depth of wound visualized     Contaminated: yes   Treatment:    Area cleansed with:  Povidone-iodine   Amount of cleaning:  Standard   Irrigation solution:  Sterile saline   Irrigation method:  Syringe   Debridement:  Minimal   Undermining:  None Skin repair:    Repair method:  Sutures   Suture  size:  5-0 and 4-0   Suture material:  Fast-absorbing gut and nylon (4-0 nylon external layer, 5-0 Monocryl for vessel tie off)   Suture technique:  Simple interrupted   Number of sutures:  11 (3 throws internally for tying off vessel, 8 external nylon simple interrupted) Approximation:    Approximation:  Close Repair type:    Repair type:  Intermediate Post-procedure details:    Dressing:  Non-adherent dressing (Xeroform)   Procedure completion:  Tolerated well, no immediate complications     ____________________________________________   INITIAL IMPRESSION / ASSESSMENT AND PLAN / ED COURSE  As part of my medical decision making, I reviewed the following data within the electronic MEDICAL RECORD NUMBER Nursing notes reviewed and incorporated and Radiograph reviewed         Patient is a 60 year old male who presents to the emergency department for evaluation of laceration to the undersurface of the left great toe that he sustained when he got scraped on a brick outside.  See HPI for further details.  On physical exam, the patient has an extensive laceration of the undersurface of the left great toe creating a small flap of the pad of the toe.  X-rays were obtained, which do not demonstrate any foreign body.  The wound was irrigated, cleaned and closed.  See procedure note for further details.  The patient will be double covered with antibiotics with Bactrim and Keflex given high risk for infection due to location and mechanism by which he sustained the injury.  Patient will also be placed in a cast shoe and asked to limit the amount of bending of the left great toe to reduce stress on the sutures.  Patient will follow up with podiatry in 7 to 10 days.  Discussed with the patient it is possible they may delay taking sutures out up to 14 days depending upon how it looks at that time.  Patient was also given a very short course of pain medication to take if needed given the severity of the  laceration and its location.  Patient is amenable with this plan.  He stable this time for outpatient therapy.      ____________________________________________   FINAL CLINICAL IMPRESSION(S) / ED DIAGNOSES  Final diagnoses:  Laceration of left great toe without foreign body present or damage to nail, initial encounter     ED Discharge Orders         Ordered    cephALEXin (KEFLEX) 500 MG capsule  2 times daily        03/11/20 2106    sulfamethoxazole-trimethoprim (BACTRIM DS) 800-160 MG tablet  2 times daily        03/11/20 2106    HYDROcodone-acetaminophen (NORCO) 5-325 MG tablet  Every 6 hours PRN        03/11/20 2111          *Please note:  John Lopez was evaluated in Emergency Department on 03/11/2020 for the symptoms described in the history of present illness. He was evaluated in the context of the global COVID-19 pandemic, which necessitated consideration that the patient might be at risk for infection with the SARS-CoV-2 virus that causes COVID-19. Institutional protocols and algorithms that pertain to the evaluation of patients at risk for COVID-19 are in a state of rapid change based on information released by regulatory bodies including the CDC and federal and state organizations. These policies and algorithms were followed during the patient's care in the ED.  Some ED evaluations and interventions may be delayed as a result of limited staffing during and the pandemic.*   Note:  This document was prepared using Dragon voice recognition software and may include unintentional dictation errors.    Lucy Chris, PA 03/11/20 2344    Merwyn Katos, MD 03/12/20 [phone]

## 2020-03-11 NOTE — Discharge Instructions (Signed)
Keep post op shoe on until follow up and suture removal in 10 days.
